# Patient Record
Sex: Male | Born: 1975 | Race: White | Hispanic: No | Marital: Married | Smoking: Current every day smoker
Health system: Southern US, Community
[De-identification: ages and names within clinical notes are randomized; demographics above are authoritative.]

## PROBLEM LIST (undated history)

## (undated) HISTORY — PX: KNEE ARTHROSCOPY WITH ANTERIOR CRUCIATE LIGAMENT (ACL) REPAIR: SHX5644

---

## 2012-09-22 ENCOUNTER — Ambulatory Visit: Payer: Self-pay

## 2015-07-22 ENCOUNTER — Other Ambulatory Visit: Payer: Self-pay | Admitting: Physician Assistant

## 2015-07-22 DIAGNOSIS — M5136 Other intervertebral disc degeneration, lumbar region: Secondary | ICD-10-CM

## 2015-08-01 ENCOUNTER — Inpatient Hospital Stay
Admission: RE | Admit: 2015-08-01 | Discharge: 2015-08-01 | Disposition: A | Discharge status: Home / Self Care | Payer: Self-pay | Source: Ambulatory Visit | Attending: Physician Assistant | Admitting: Physician Assistant

## 2015-08-01 ENCOUNTER — Other Ambulatory Visit: Payer: Self-pay | Admitting: Physician Assistant

## 2015-08-01 ENCOUNTER — Ambulatory Visit
Admission: RE | Admit: 2015-08-01 | Discharge: 2015-08-01 | Disposition: A | Discharge status: Home / Self Care | Payer: Worker's Compensation | Source: Ambulatory Visit | Attending: Physician Assistant | Admitting: Physician Assistant

## 2015-08-01 DIAGNOSIS — M5136 Other intervertebral disc degeneration, lumbar region: Secondary | ICD-10-CM

## 2015-08-01 MED ORDER — DIAZEPAM 5 MG PO TABS: 10.0000 mg | ORAL_TABLET | Freq: Once | ORAL | Status: DC

## 2015-08-01 NOTE — Discharge Instructions (Signed)

## 2015-08-08 ENCOUNTER — Other Ambulatory Visit: Payer: Self-pay | Admitting: Orthopedic Surgery

## 2015-08-08 DIAGNOSIS — G8929 Other chronic pain: Secondary | ICD-10-CM

## 2015-08-08 DIAGNOSIS — M5442 Lumbago with sciatica, left side: Principal | ICD-10-CM

## 2015-08-15 ENCOUNTER — Ambulatory Visit
Admission: RE | Admit: 2015-08-15 | Discharge: 2015-08-15 | Disposition: A | Discharge status: Home / Self Care | Payer: Worker's Compensation | Source: Ambulatory Visit | Attending: Orthopedic Surgery | Admitting: Orthopedic Surgery

## 2015-08-15 ENCOUNTER — Other Ambulatory Visit: Payer: Self-pay | Admitting: Orthopedic Surgery

## 2015-08-15 VITALS — BP 143/76 | HR 75 | Temp 98.0°F | Resp 16

## 2015-08-15 DIAGNOSIS — M545 Low back pain: Secondary | ICD-10-CM

## 2015-08-15 DIAGNOSIS — M5442 Lumbago with sciatica, left side: Secondary | ICD-10-CM

## 2015-08-15 DIAGNOSIS — G8929 Other chronic pain: Secondary | ICD-10-CM

## 2015-08-15 MED ORDER — IOHEXOL 180 MG/ML  SOLN: 6.5000 mL | Freq: Once | INTRAMUSCULAR | Status: DC | PRN

## 2015-08-15 MED ORDER — MIDAZOLAM HCL 2 MG/2ML IJ SOLN
1.0000 mg | INTRAMUSCULAR | Status: DC | PRN
  Administered 2015-08-15: 1 mg via INTRAVENOUS

## 2015-08-15 MED ORDER — FENTANYL CITRATE (PF) 100 MCG/2ML IJ SOLN
25.0000 ug | INTRAMUSCULAR | Status: DC | PRN
  Administered 2015-08-15: 100 ug via INTRAVENOUS

## 2015-08-15 MED ORDER — MEPERIDINE HCL 100 MG/ML IJ SOLN
100.0000 mg | Freq: Once | INTRAMUSCULAR | Status: AC
  Administered 2015-08-15: 100 mg via INTRAMUSCULAR

## 2015-08-15 MED ORDER — ONDANSETRON HCL 4 MG/2ML IJ SOLN
4.0000 mg | Freq: Once | INTRAMUSCULAR | Status: AC
  Administered 2015-08-15: 4 mg via INTRAMUSCULAR

## 2015-08-15 MED ORDER — SODIUM CHLORIDE 0.9 % IV SOLN
Freq: Once | INTRAVENOUS | Status: AC
  Administered 2015-08-15: 08:00:00 via INTRAVENOUS

## 2015-08-15 MED ORDER — DEXTROSE 5 % IV SOLN
3.0000 g | Freq: Once | INTRAVENOUS | Status: AC
  Administered 2015-08-15: 3 g via INTRAVENOUS

## 2015-08-15 MED ORDER — KETOROLAC TROMETHAMINE 30 MG/ML IJ SOLN
30.0000 mg | Freq: Once | INTRAMUSCULAR | Status: AC
  Administered 2015-08-15: 30 mg via INTRAVENOUS

## 2015-08-15 NOTE — Discharge Instructions (Signed)
Discogram Post Procedure Discharge Instructions ° °1. May resume a regular diet and any medications that you routinely take (including pain medications). °2. No driving day of procedure. °3. Upon discharge go home and rest for at least 4 hours.  May use an ice pack as needed to injection sites on back.  Ice to back 30 minutes on and 30 minutes off, all day. °4. May remove bandades later, today. °5. It is not unusual to be sore for several days after this procedure. ° ° ° °Please contact our office at [phone] for the following symptoms: ° °· Fever greater than 100 degrees °· Increased swelling, pain, or redness at injection site. ° ° °Thank you for visiting [facility]. ° ° °

## 2017-05-03 IMAGING — CT CT L SPINE W/ CM
3 of 6 series · 11 of 33 positions shown, 13 images · non-contrast
Comparison: none

CLINICAL DATA: Back pain extending from low back to the scapula.
Bilateral hip pain, left greater than right. Pain extends to the
lateral left thigh.

[Series 3: l spine soft · axial · 0.27mm/px · z∈[-296,-188]mm · 3 of 63 slices shown, 4 images]
[im 18/63  soft-tissue]
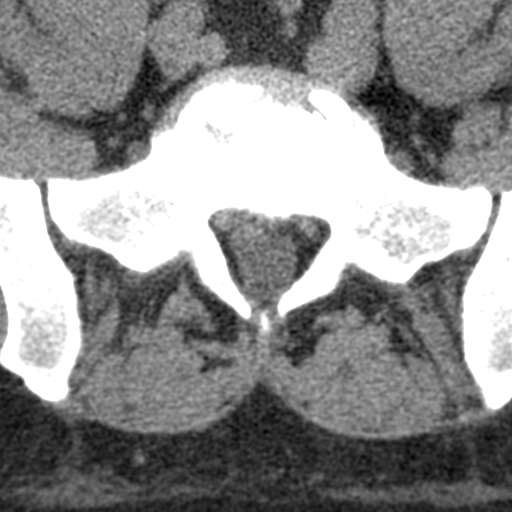
[im 18/63  bone]
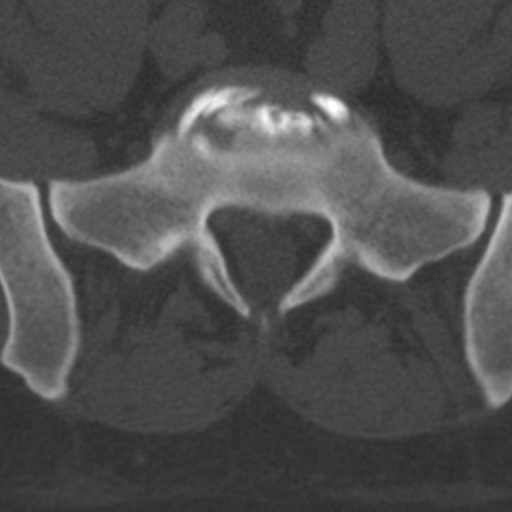
[im 36/63  bone]
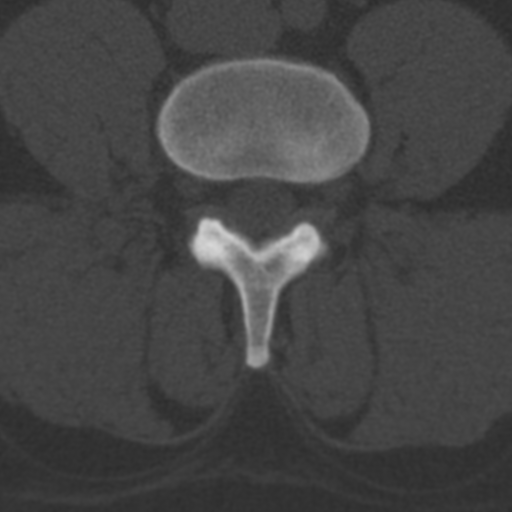
[im 54/63  bone]
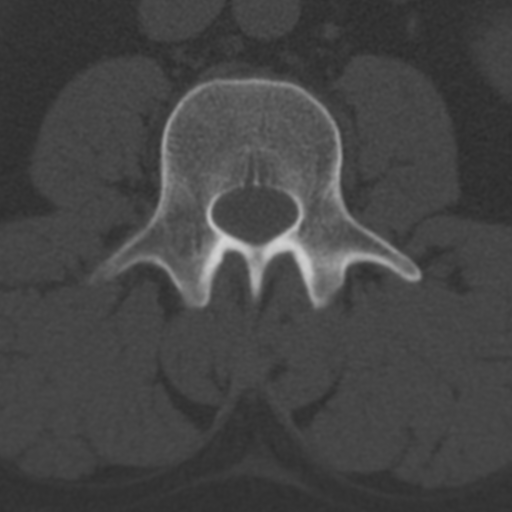

[Series 7: bone cor · coronal · 0.30mm/px · 3 of 31 slices shown]
[im 7/31  bone]
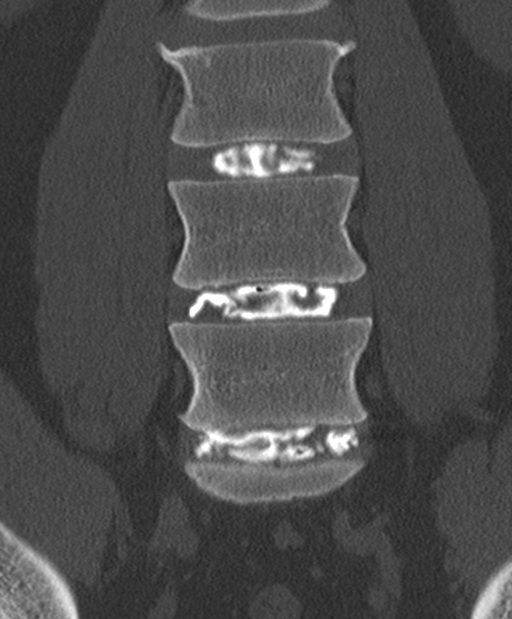
[im 13/31  bone]
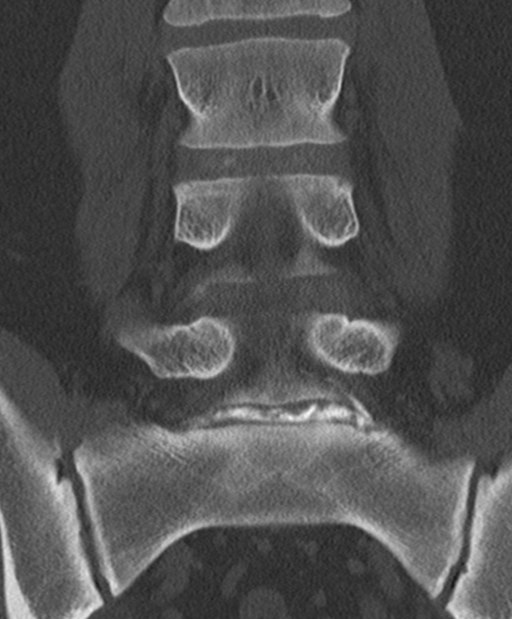
[im 19/31  bone]
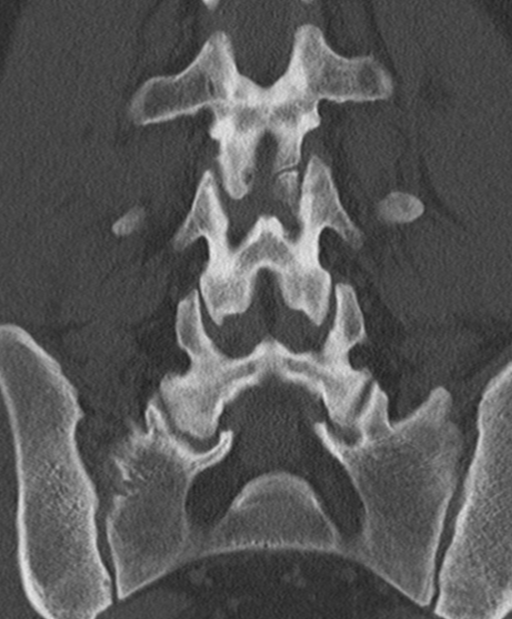

[Series 8: sag bone · sagittal · 0.25mm/px · 5 of 36 slices shown, 6 images]
[im 12/36  bone]
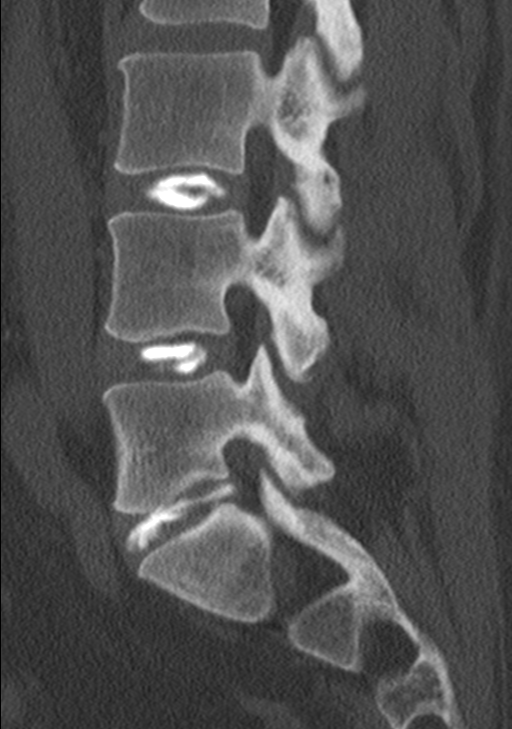
[im 15/36  bone]
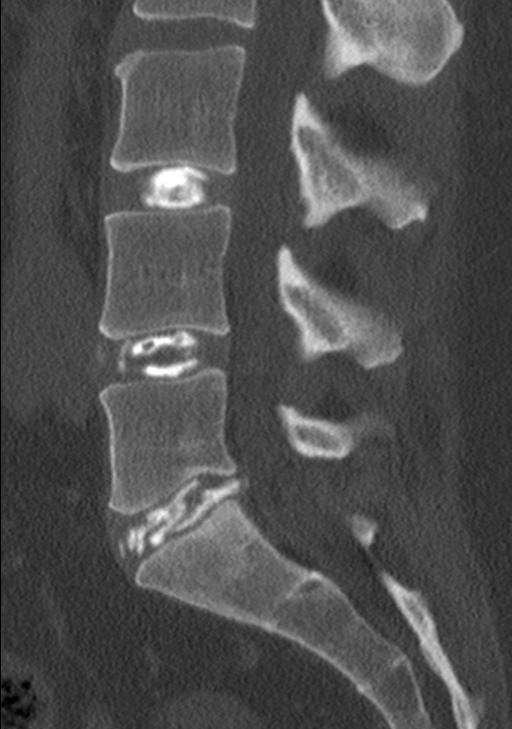
[im 18/36  soft-tissue]
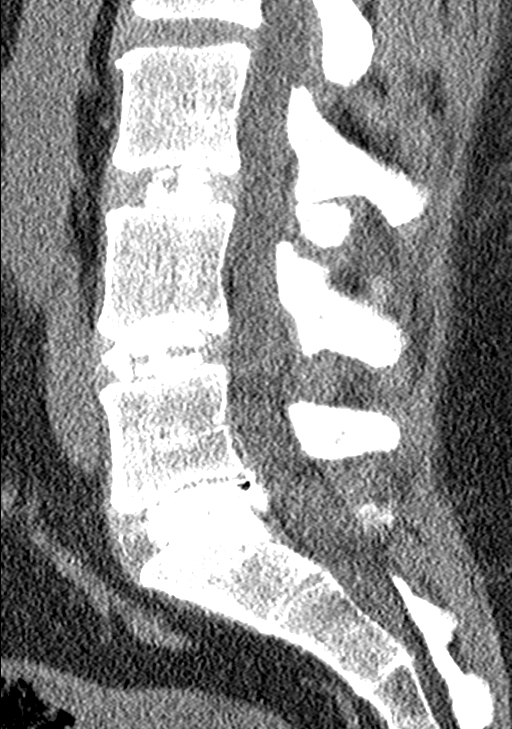
[im 18/36  bone]
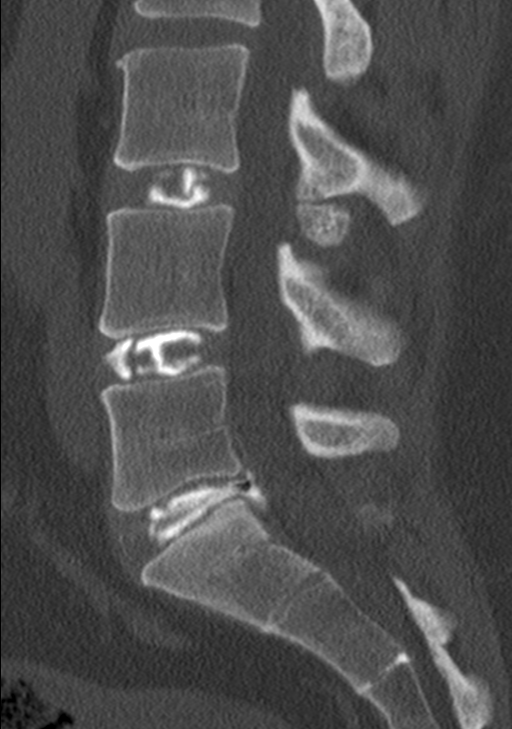
[im 21/36  bone]
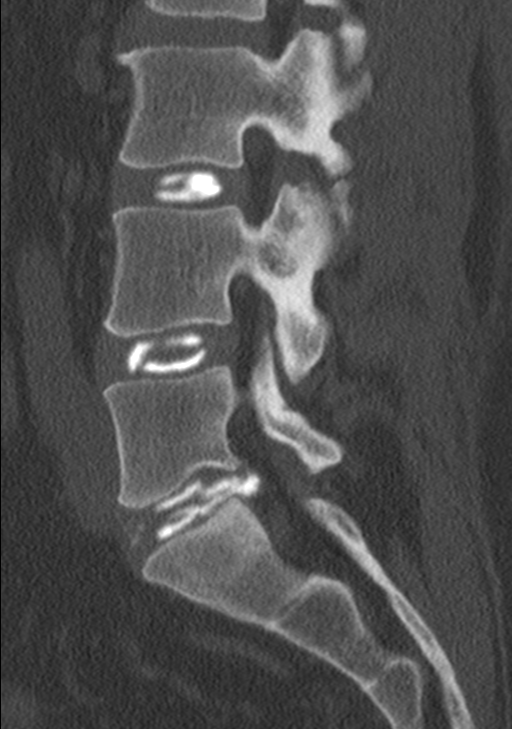
[im 24/36  bone]
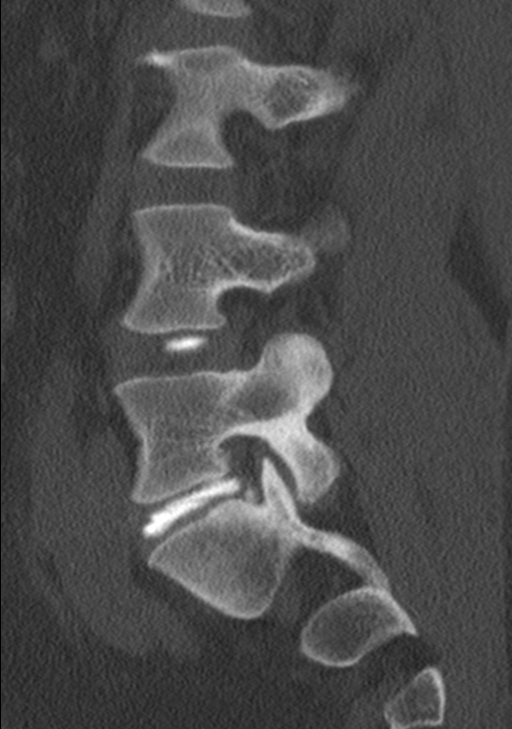

[11 of 33 positions shown; findings below may reference images not displayed]

EXAM:
DIAGNOSTIC LUMBAR DISK INJECTIONS

FLUOROSCOPY TIME:  40027.49

PROCEDURE:
The procedure was discussed in depth with the patient including the
potential risk of infection. The patient received 3.0 gram of Ancef
intravenously prior to the procedure with a small amount withdrawn
and added to the contrast for injection. He received 1.0 mg of
Versed intravenously prior to the procedure.

The patient was placed prone on the fluoroscopic table. A Betadine
scrub of the low back was performed and the patient was draped in a
sterile fashion.

Skin anesthesia was carried [DATE]% Lidocaine. 15 cm 22 gauge
Walnick Cato were directed into the nuclear regions of the disks at
L3-4 and L4-5 from a right posterolateral approach. I used an 18
gauge spinal needle at the L5-S1 level. A 20 cm 22 gauge needle was
then placed coaxially and directed into the disc space. Omnipaque
180 mixed with Ancef was injected using a pressure monitoring
syringe. Spot radiographs were taken. The patient's symptoms were
recorded. Following the procedure, he was treated with intravenous
Toradol 30 mg and Fentanyl 100 mcg. He was taken to CT scan in good
condition. The patient was observed for an hour after the CT scan
and discharged in good condition.
FINDINGS: The patient reported pain at 6-7/10 prior to the procedure.

Following placement of right-sided needles the patient had
consistent right hip pain.

L3-4: The opening pressure was 20 PSI. The patient experienced pain
at 30 PSI. The pain was similar to his typical pain in the right low
back and right hip. 1.5 ml of contrast was used. The patient rated
his pain as [DATE]. He described the pain as localize to the right low
back and right hip. Contrast was contained in the central nucleus of
the disc.

L4-5: The opening pressure was 20 PSI. The patient experienced pain
at 20 PSI. The pain was similar to his low back pain. He stated that
injection at this level "Hit the needle on the head" he did not have
significant pain into the hips. 2.0 ml of contrast was used. The
patient rated his pain as 8-9/10. He described the pain as localize
to the low back without significant radiation to the hips. Contrast
extends into the anterior annular fibers.

L5-S1: The opening pressure was 10 PSI. The patient experienced pain
at 30 PSI. The pain was typical of his low back pain. He also had
pain extending into the left hip with this injection. 3.0 ml of
contrast was used. The patient rated his pain as [DATE]. He described
the pain as localized the the low back and left hip. This was the
only injection which provoked pain in the left hip. Contrast per it
is diffusely throughout the disc.

CT of the lumbar spine was obtained from the inferior endplate of L2
through the S2-3 level. Vertebral body heights and alignment are
maintained. Limited imaging of the abdomen is unremarkable.

L3-4: Contrast is contained within the central nucleus of the disc.
There is no significant focal stenosis. Mild facet hypertrophy is
present bilaterally.

L4-5: An anterior annular tear is present. There is some contrast
extending posteriorly to the right along the needle track. No
significant left-sided annular tear or protrusion is present. The
central canal and foramina are patent. Mild facet hypertrophy is
noted bilaterally.

L5-S1: There is diffuse disc degeneration with contrast extending
throughout the annular portions of the disc. A focal annular tear is
present in the posterolateral aspect of the disc extending into the
epidural space and results an mild left subarticular stenosis. There
is also contrast into the left foramen. Mild left foraminal
narrowing is noted.
IMPRESSION: 1. Concordant pain at a left paramedian disc protrusion and annular
tear at L5-S1. This results in mild left subarticular and foraminal
stenosis. Injection at this level is the only level at reproduced
the patient's left hip and thigh pain.
2. Concordant central low back pain at L4-5. There is an anterior
annular tear at this level. Although no compressive stenosis is
present, this appears to be a hemo sensitive symptomatic disc level.
3. Although there was some exacerbation of the patient's low back
pain at the L3-4 level, this was less typical than the L4-5 level
and it did not produce any significant left-sided symptoms. This
likely represents a normal disc level for this patient.

## 2017-11-12 ENCOUNTER — Emergency Department (HOSPITAL_BASED_OUTPATIENT_CLINIC_OR_DEPARTMENT_OTHER)
Admission: EM | Admit: 2017-11-12 | Discharge: 2017-11-12 | Disposition: A | Discharge status: Home / Self Care | Payer: Worker's Compensation | Attending: Emergency Medicine | Admitting: Emergency Medicine

## 2017-11-12 ENCOUNTER — Other Ambulatory Visit: Payer: Self-pay

## 2017-11-12 ENCOUNTER — Encounter (HOSPITAL_BASED_OUTPATIENT_CLINIC_OR_DEPARTMENT_OTHER): Payer: Self-pay | Admitting: *Deleted

## 2017-11-12 ENCOUNTER — Emergency Department (HOSPITAL_BASED_OUTPATIENT_CLINIC_OR_DEPARTMENT_OTHER): Payer: Worker's Compensation

## 2017-11-12 DIAGNOSIS — M79662 Pain in left lower leg: Secondary | ICD-10-CM | POA: Diagnosis not present

## 2017-11-12 DIAGNOSIS — W1789XA Other fall from one level to another, initial encounter: Secondary | ICD-10-CM | POA: Insufficient documentation

## 2017-11-12 DIAGNOSIS — F17210 Nicotine dependence, cigarettes, uncomplicated: Secondary | ICD-10-CM | POA: Insufficient documentation

## 2017-11-12 MED ORDER — KETOROLAC TROMETHAMINE 30 MG/ML IJ SOLN
30.0000 mg | Freq: Once | INTRAMUSCULAR | Status: AC
  Administered 2017-11-12: 30 mg via INTRAMUSCULAR
  Filled 2017-11-12: qty 1

## 2017-11-12 MED ORDER — HYDROCODONE-ACETAMINOPHEN 5-325 MG PO TABS
1.0000 | ORAL_TABLET | ORAL | 0 refills | Status: AC | PRN
Start: 2017-11-12 — End: ?

## 2017-11-12 NOTE — ED Triage Notes (Signed)
Pt c/o left calf injury after jumping off bed of truck, Sent here for UC for eval

## 2017-11-12 NOTE — ED Notes (Signed)
Discussed with PA , tib fib ordered

## 2017-11-12 NOTE — Discharge Instructions (Signed)

## 2017-11-12 NOTE — ED Provider Notes (Signed)
[facility] EMERGENCY DEPARTMENT Provider Note   CSN: [id number] Arrival date & time: 11/12/17  1512     History   Chief Complaint Chief Complaint  Patient presents with  . left calf injury    HPI William Fields is a 42 y.o. male who presents today for evaluation of left calf pain.  He reports that this morning he was jumping approximately 3 feet off the bed of a truck and when he landed he felt a tear in his left calf.  He reports that this is an isolated injury, did not otherwise fall strike his head or injure himself.  He reports that he had immediate onset of pain.  He had leftover Percocet from when he had his ACL repaired and reports that he took 1 of those around noon without significant improvement.  He was initially evaluated at either urgent care or occupational health who sent him here.  He denies any numbness or tingling.  He has not been able to bear weight on the leg since the injury happened.    Chart review showed that between 10/23/2017 and 11/01/2017 he was seen for left calf and upper leg pain.  This was treated with steroids, naproxen.  He had a ultrasound obtained without obvious DVT then.    HPI  History reviewed. No pertinent past medical history.  There are no active problems to display for this patient.   Past Surgical History:  Procedure Laterality Date  . KNEE ARTHROSCOPY WITH ANTERIOR CRUCIATE LIGAMENT (ACL) REPAIR         Home Medications    Prior to Admission medications   Medication Sig Start Date End Date Taking? Authorizing Provider  HYDROcodone-acetaminophen (NORCO/VICODIN) 5-325 MG tablet Take 1 tablet by mouth every 4 (four) hours as needed. 11/12/17   Cristina Gong, PA-C    Family History History reviewed. No pertinent family history.  Social History Social History   Tobacco Use  . Smoking status: Current Every Day Smoker    Packs/day: 1.00    Years: 20.00    Pack years: 20.00    Types: Cigarettes  . Smokeless  tobacco: Never Used  Substance Use Topics  . Alcohol use: No    Alcohol/week: 0.0 oz    Frequency: Never  . Drug use: No     Allergies   Patient has no known allergies.   Review of Systems Review of Systems  Constitutional: Negative for fatigue and fever.  Musculoskeletal:       Left lower leg pain, sudden onset this morning.  Skin: Negative for color change, pallor, rash and wound.  Neurological: Negative for weakness and numbness.     Physical Exam Updated Vital Signs BP 132/89 (BP Location: Right Arm)   Pulse 78   Temp 97.7 F (36.5 C) (Oral)   Resp 20   Ht 6\' 2"  (1.88 m)   Wt 117.9 kg (260 lb)   SpO2 95%   BMI 33.38 kg/m   Physical Exam  Constitutional: He appears well-developed and well-nourished.  HENT:  Head: Normocephalic and atraumatic.  Cardiovascular: Intact distal pulses.  2+ DP/PT pulses bilaterally.  Bilateral feet are warm and well perfused with intact sensation.  Musculoskeletal:  Left lower leg has pain, tenderness to palpation, and swelling over posterior aspect.  Compartments are soft and easily compressible.  There is minimal pain with passive range of motion, pain with active range of motion.  He has decreased strength with ankle plantarflexion secondary to pain, 4/5 strength with  ankle dorsiflexion.  Full knee ROM.  Achilles tendon feels intact, palpation over achilles tendon is non painful.   Neurological: He is alert.  Sensation intact to bilateral lower extremities.  Skin: Skin is warm and dry. He is not diaphoretic. No pallor.  Nursing note and vitals reviewed.    ED Treatments / Results  Labs (all labs ordered are listed, but only abnormal results are displayed) Labs Reviewed - No data to display  EKG  EKG Interpretation None       Radiology Dg Tibia/fibula Left  Result Date: 11/12/2017 CLINICAL DATA:  Calf pain and swelling. EXAM: LEFT TIBIA AND FIBULA - 2 VIEW COMPARISON:  No recent. FINDINGS: No acute bony or joint  abnormality identified. No evidence of fracture or dislocation. IMPRESSION: No acute or focal abnormality. Electronically Signed   By: Maisie Fushomas  Register   On: 11/12/2017 15:49    Procedures Procedures (including critical care time)  Medications Ordered in ED Medications  ketorolac (TORADOL) 30 MG/ML injection 30 mg (30 mg Intramuscular Given 11/12/17 1648)     Initial Impression / Assessment and Plan / ED Course  I have reviewed the triage vital signs and the nursing notes.  Pertinent labs & imaging results that were available during my care of the patient were reviewed by me and considered in my medical decision making (see chart for details).    Molly Maduroobert [facility] Center[facility]uffey evaluation of sudden onset left calf pain after reportedly jumping approximately 3 feet out of a truck at work.  There is swelling to the posterior calf with associated TTP.  Compartments are soft, distal extremity is warm and well perfused, plantar flexion strength limited by pain.  Discussed ACE wrap, he declined at this time.  He drove him self so unable to give narcotic pain medicine, he has tolerated Toradol in the past so will give him a dose here with rx for home meds.  Advised will need follow up in 1-2 days with PCP for pain meds and with orthopedics for further evaluation.    Final Clinical Impressions(s) / ED Diagnoses   Final diagnoses:  Pain of left calf    ED Discharge Orders        Ordered    HYDROcodone-acetaminophen (NORCO/VICODIN) 5-325 MG tablet  Every 4 hours PRN     11/12/17 1653       Cristina GongHammond, Elizabeth W, [place] JerseyPA-C 11/12/17 1653    Pricilla LovelessGoldston, Scott, MD 11/13/17 [phone]

## 2017-11-12 NOTE — ED Notes (Signed)
Pt verbalizes understanding of d/c instructions and denies any further needs at this time.
